# Patient Record
Sex: Female | Born: 1995 | Race: White | Hispanic: No | Marital: Single | Smoking: Never smoker
Health system: Southern US, Community
[De-identification: ages and names within clinical notes are randomized; demographics above are authoritative.]

---

## 2014-08-14 ENCOUNTER — Encounter (HOSPITAL_COMMUNITY): Payer: Self-pay | Admitting: Family Medicine

## 2014-08-14 ENCOUNTER — Emergency Department (HOSPITAL_COMMUNITY)
Admission: EM | Admit: 2014-08-14 | Discharge: 2014-08-14 | Disposition: A | Discharge status: Home / Self Care | Payer: Self-pay | Attending: Emergency Medicine | Admitting: Emergency Medicine

## 2014-08-14 ENCOUNTER — Emergency Department (HOSPITAL_COMMUNITY): Payer: Self-pay

## 2014-08-14 DIAGNOSIS — Y998 Other external cause status: Secondary | ICD-10-CM | POA: Insufficient documentation

## 2014-08-14 DIAGNOSIS — Y9389 Activity, other specified: Secondary | ICD-10-CM | POA: Insufficient documentation

## 2014-08-14 DIAGNOSIS — Y9289 Other specified places as the place of occurrence of the external cause: Secondary | ICD-10-CM | POA: Insufficient documentation

## 2014-08-14 DIAGNOSIS — M79672 Pain in left foot: Secondary | ICD-10-CM

## 2014-08-14 DIAGNOSIS — S99922A Unspecified injury of left foot, initial encounter: Secondary | ICD-10-CM | POA: Insufficient documentation

## 2014-08-14 DIAGNOSIS — W1849XA Other slipping, tripping and stumbling without falling, initial encounter: Secondary | ICD-10-CM | POA: Insufficient documentation

## 2014-08-14 MED ORDER — IBUPROFEN 800 MG PO TABS: 800.0000 mg | ORAL_TABLET | Freq: Three times a day (TID) | ORAL | Status: AC

## 2014-08-14 NOTE — ED Notes (Signed)
Pt here for left foot pain. sts started hurting the day after she went to [place]. denies specific injury

## 2014-08-14 NOTE — ED Notes (Signed)
Patient transported to X-ray 

## 2014-08-14 NOTE — Discharge Instructions (Signed)
RICE therapy - Rest, Ice, Compress, Elevate Alternate heat and ice treatments Use ibuprofen 3 times daily to help treat inflammation. Follow up with Ortho if you do not improve in 1-2 weeks.  Heat Therapy Heat therapy can help ease sore, stiff, injured, and tight muscles and joints. Heat relaxes your muscles, which may help ease your pain.  RISKS AND COMPLICATIONS If you have any of the following conditions, do not use heat therapy unless your health care provider has approved:  Poor circulation.  Healing wounds or scarred skin in the area being treated.  Diabetes, heart disease, or high blood pressure.  Not being able to feel (numbness) the area being treated.  Unusual swelling of the area being treated.  Active infections.  Blood clots.  Cancer.  Inability to communicate pain. This may include young children and people who have problems with their brain function (dementia).  Pregnancy. Heat therapy should only be used on old, pre-existing, or long-lasting (chronic) injuries. Do not use heat therapy on new injuries unless directed by your health care provider. HOW TO USE HEAT THERAPY There are several different kinds of heat therapy, including:  Moist heat pack.  Warm water bath.  Hot water bottle.  Electric heating pad.  Heated gel pack.  Heated wrap.  Electric heating pad. Use the heat therapy method suggested by your health care provider. Follow your health care provider's instructions on when and how to use heat therapy. GENERAL HEAT THERAPY RECOMMENDATIONS  Do not sleep while using heat therapy. Only use heat therapy while you are awake.  Your skin may turn pink while using heat therapy. Do not use heat therapy if your skin turns red.  Do not use heat therapy if you have new pain.  High heat or long exposure to heat can cause burns. Be careful when using heat therapy to avoid burning your skin.  Do not use heat therapy on areas of your skin that are  already irritated, such as with a rash or sunburn. SEEK MEDICAL CARE IF:  You have blisters, redness, swelling, or numbness.  You have new pain.  Your pain is worse. MAKE SURE YOU:  Understand these instructions.  Will watch your condition.  Will get help right away if you are not doing well or get worse. Document Released: 05/03/2011 Document Revised: 06/25/2013 Document Reviewed: 04/03/2013 [facility] Patient Information 2015 [place], [place]. This information is not intended to replace advice given to you by your health care provider. Make sure you discuss any questions you have with your health care provider.  Musculoskeletal Pain Musculoskeletal pain is muscle and boney aches and pains. These pains can occur in any part of the body. Your caregiver may treat you without knowing the cause of the pain. They may treat you if blood or urine tests, X-rays, and other tests were normal.  CAUSES There is often not a definite cause or reason for these pains. These pains may be caused by a type of germ (virus). The discomfort may also come from overuse. Overuse includes working out too hard when your body is not fit. Boney aches also come from weather changes. Bone is sensitive to atmospheric pressure changes. HOME CARE INSTRUCTIONS   Ask when your test results will be ready. Make sure you get your test results.  Only take over-the-counter or prescription medicines for pain, discomfort, or fever as directed by your caregiver. If you were given medications for your condition, do not drive, operate machinery or power tools, or sign legal  documents for 24 hours. Do not drink alcohol. Do not take sleeping pills or other medications that may interfere with treatment.  Continue all activities unless the activities cause more pain. When the pain lessens, slowly resume normal activities. Gradually increase the intensity and duration of the activities or exercise.  During periods of severe pain, bed rest  may be helpful. Lay or sit in any position that is comfortable.  Putting ice on the injured area.  Put ice in a bag.  Place a towel between your skin and the bag.  Leave the ice on for 15 to 20 minutes, 3 to 4 times a day.  Follow up with your caregiver for continued problems and no reason can be found for the pain. If the pain becomes worse or does not go away, it may be necessary to repeat tests or do additional testing. Your caregiver may need to look further for a possible cause. SEEK IMMEDIATE MEDICAL CARE IF:  You have pain that is getting worse and is not relieved by medications.  You develop chest pain that is associated with shortness or breath, sweating, feeling sick to your stomach (nauseous), or throw up (vomit).  Your pain becomes localized to the abdomen.  You develop any new symptoms that seem different or that concern you. MAKE SURE YOU:   Understand these instructions.  Will watch your condition.  Will get help right away if you are not doing well or get worse. Document Released: 02/08/2005 Document Revised: 05/03/2011 Document Reviewed: 10/13/2012 [facility] Patient Information 2015 [place], [place]. This information is not intended to replace advice given to you by your health care provider. Make sure you discuss any questions you have with your health care provider.

## 2014-08-14 NOTE — ED Provider Notes (Signed)
CSN: [id number]     Arrival date & time 08/14/14  6546 History   First MD Initiated Contact with Patient 08/14/14 0845    This chart was scribed for non-physician practitioner, Danelle Berry, PA-C, working with Derwood Kaplan, MD by Marica Otter, ED Scribe. This patient was seen in room TR07C/TR07C and the patient's care was started at 9:03 AM.  Chief Complaint  Patient presents with  . Foot Pain   The history is provided by the patient. No language interpreter was used.   PCP: No primary care provider on file. HPI Comments: Lauren Reeves is a 19 y.o. female who presents to the Emergency Department complaining of atraumatic, sudden onset, worsening, constant, ongoing, 3/10 left foot pain radiating to the left ankle onset one week ago, a day after a trip to [place]. Pt specifies the pain is in the sides and center of the left foot; and it's painful both with flexion and extension of left foot. Pt describes the pain as a pulling pain, like "elastic."  Pt reports she is able to walk with difficulty due to the pain. Pt denies any specific injury or strenuous activity immediately prior to the onset of her Sx. Pt reports using ibuprofen, icy hot and icing the affected area at home without relief. Pt denies numbness, daily meds, or any chronic conditions.  History reviewed. No pertinent past medical history. History reviewed. No pertinent past surgical history. History reviewed. No pertinent family history. History  Substance Use Topics  . Smoking status: Never Smoker   . Smokeless tobacco: Not on file  . Alcohol Use: No   OB History    No data available     Review of Systems  Constitutional: Negative for fever and chills.  Musculoskeletal: Gait problem: due to pain.       Left foot pain and left ankle pain  Skin: Negative for color change, pallor, rash and wound.  Neurological: Negative for numbness.      Allergies  Review of patient's allergies indicates no known allergies.  Home  Medications   Prior to Admission medications   Not on File   Triage Vitals: BP 105/62 mmHg  Pulse 73  Temp(Src) 98 F (36.7 C)  Resp 18  Ht 5' (1.524 m)  Wt 102 lb (46.267 kg)  BMI 19.92 kg/m2  SpO2 100%  LMP 08/12/2014 Physical Exam  Constitutional: She is oriented to person, place, and time. She appears well-developed and well-nourished. No distress.  HENT:  Head: Normocephalic and atraumatic.  Eyes: Conjunctivae and EOM are normal.  Neck: Neck supple. No tracheal deviation present.  Cardiovascular: Normal rate.   Pulmonary/Chest: Effort normal. No respiratory distress.  Musculoskeletal: Normal range of motion. She exhibits tenderness. She exhibits no edema.       Left foot: There is tenderness. There is normal range of motion, no bony tenderness, no swelling, normal capillary refill, no crepitus, no deformity and no laceration.  Left Foot: No obvious deformities, ROM normal, no edema, no erythema, symmetrical appearance, tenderness to palpation along 4th and 5th metatarsals, no bony tenderness in soft tissue below left ankle. No achilles defect or tenderness to palpation. No tenderness to plantar tendon   Neurological: She is alert and oriented to person, place, and time.  Skin: Skin is warm and dry.  Psychiatric: She has a normal mood and affect. Her behavior is normal.  Nursing note and vitals reviewed.   ED Course  Procedures (including critical care time) DIAGNOSTIC STUDIES: Oxygen Saturation is 100% on  RA, nl by my interpretation.    COORDINATION OF CARE: 9:08 AM-Discussed treatment plan which includes imaging of left foot to rule out possibility of fracturee and ortho referral with pt at bedside and pt agreed to plan.   Labs Review Labs Reviewed - No data to display  Imaging Review No results found.   EKG Interpretation None      MDM   Final diagnoses:  None    Pt with foot/toe pain for one week, without trauma and no swelling, redness or fx on xray.    Will give ace wrap, ibuprofen and crutches for comfort.  No acute pathology found on Xray, or evident on exam.   Pt stable for discharge and encouraged follow-up (establish care) with a PCP - [facility] given.      Danelle Berry, PA-C 08/19/14 1610  Derwood Kaplan, MD 08/20/14 [phone]

## 2016-06-13 IMAGING — DX DG FOOT COMPLETE 3+V*L*
3 series · 3 of 3 positions shown · non-contrast
Comparison: None.

CLINICAL DATA: Initial encounter for left foot pain beginning about
1 week ago.

EXAM:
LEFT FOOT - COMPLETE 3+ VIEW

[foot ap]
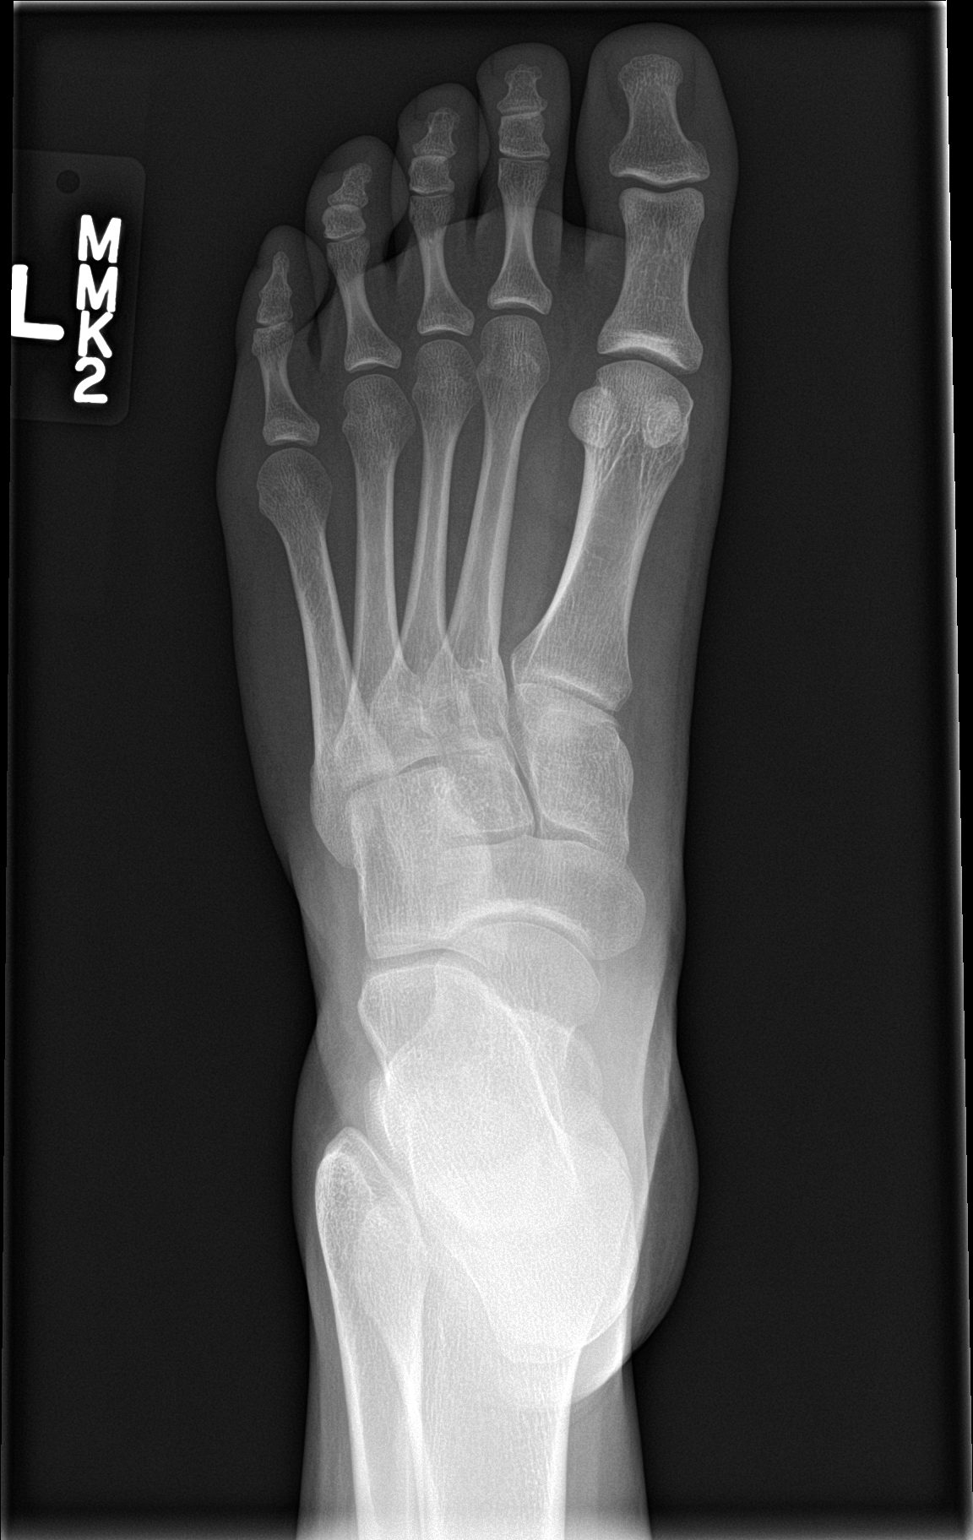

[foot obl]
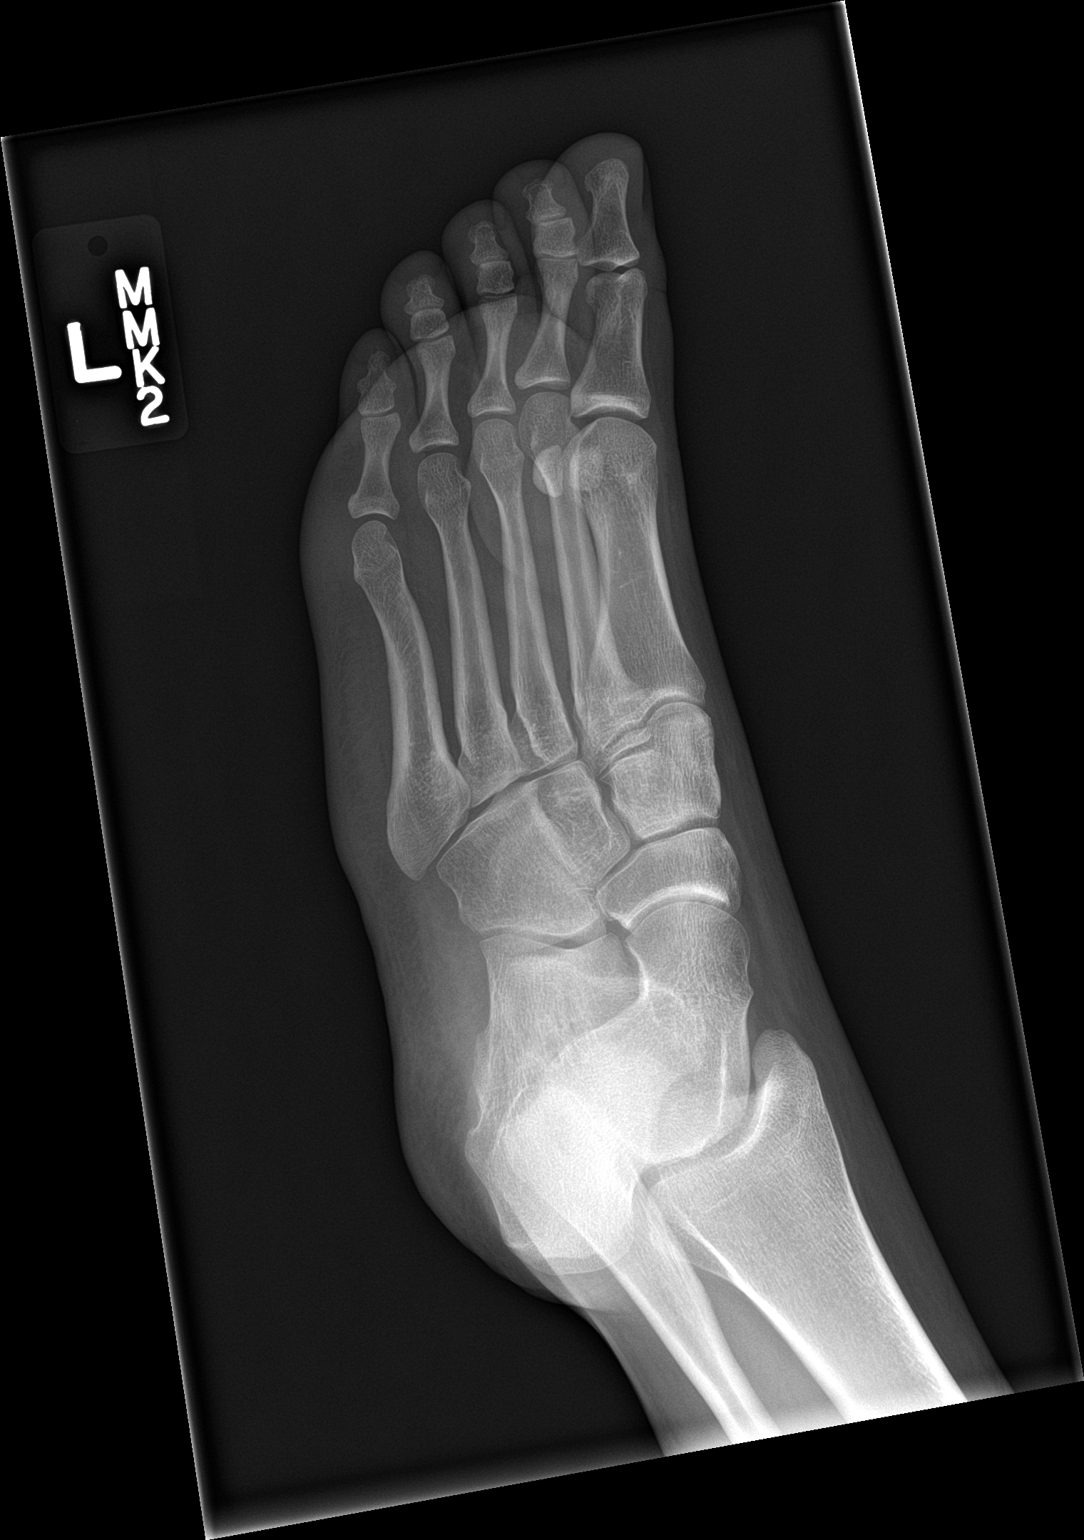

[foot lat]
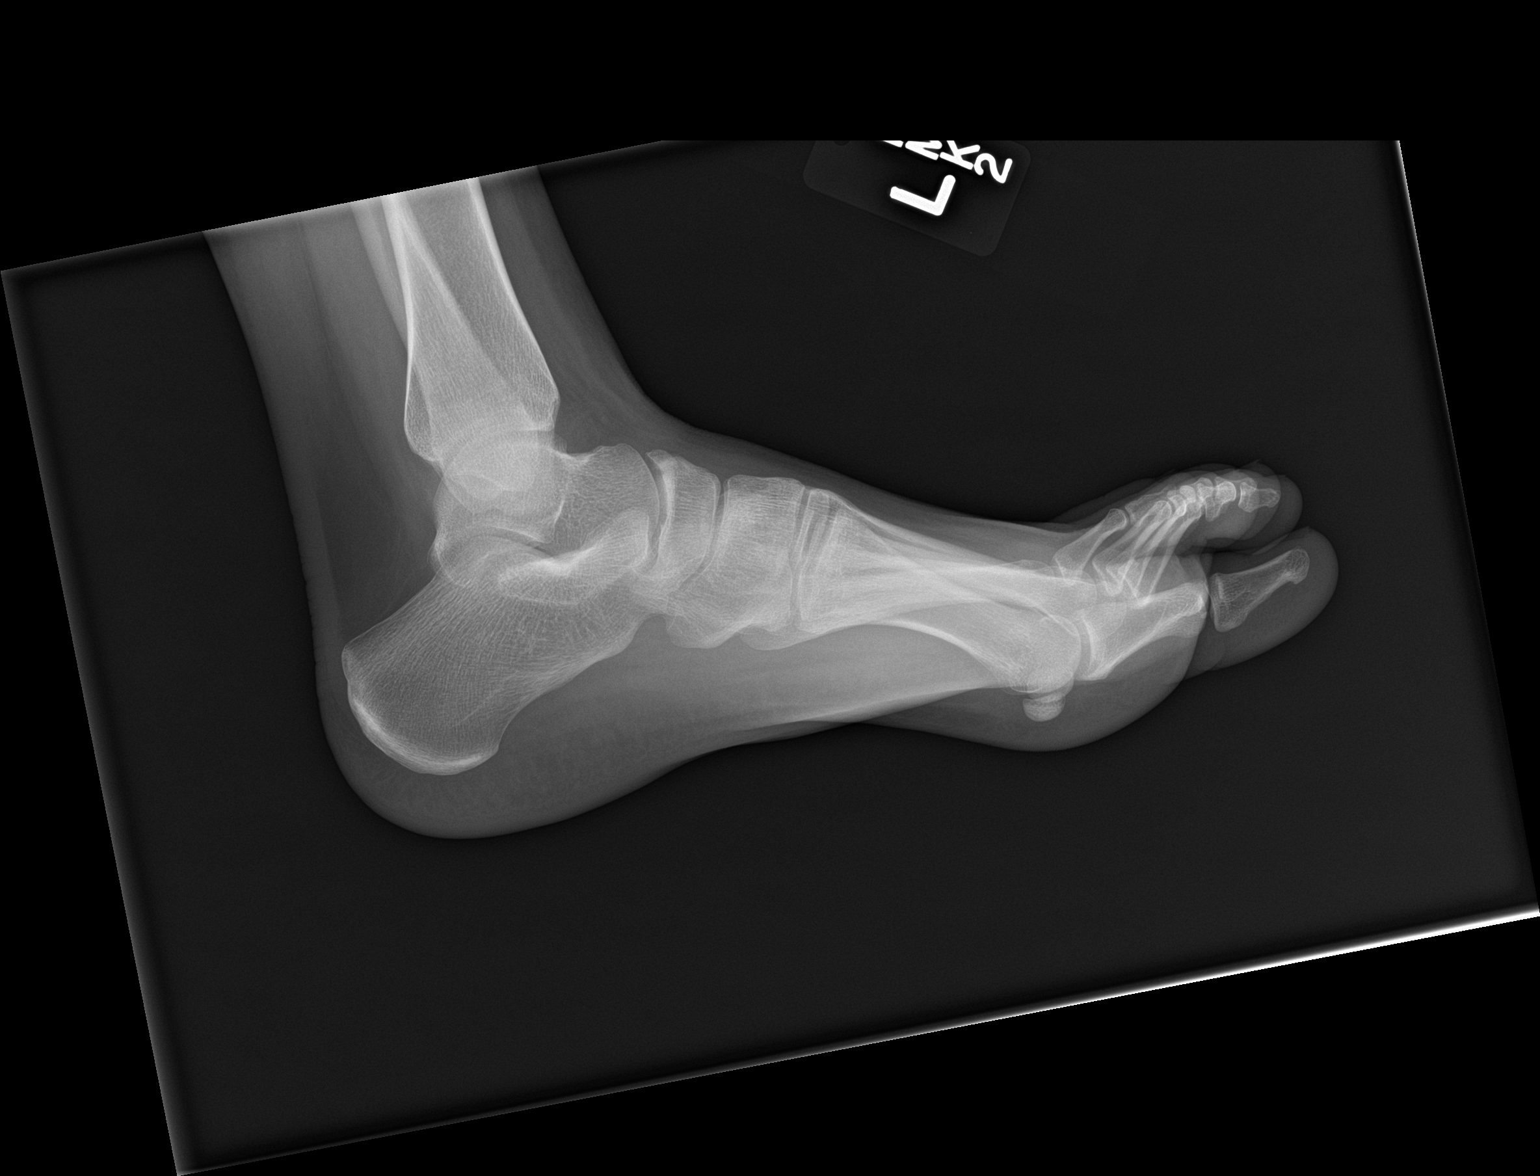

[3 of 3 positions shown; findings below may reference images not displayed]

FINDINGS: There is no evidence of fracture or dislocation. There is no
evidence of arthropathy or other focal bone abnormality. Soft
tissues are unremarkable.
IMPRESSION: Negative.
